# Patient Record
Sex: Male | Born: 1970 | Race: White | Hispanic: No | Marital: Single | Smoking: Never smoker
Health system: Southern US, Community
[De-identification: ages and names within clinical notes are randomized; demographics above are authoritative.]

## PROBLEM LIST (undated history)

## (undated) HISTORY — PX: FRACTURE SURGERY: SHX138

---

## 2002-06-23 ENCOUNTER — Inpatient Hospital Stay (HOSPITAL_COMMUNITY): Admission: AC | Admit: 2002-06-23 | Discharge: 2002-06-26 | Payer: Self-pay

## 2002-06-23 ENCOUNTER — Encounter: Payer: Self-pay | Admitting: Emergency Medicine

## 2002-06-23 ENCOUNTER — Encounter: Payer: Self-pay | Admitting: General Surgery

## 2002-06-24 ENCOUNTER — Encounter: Payer: Self-pay | Admitting: General Surgery

## 2002-07-02 ENCOUNTER — Ambulatory Visit (HOSPITAL_COMMUNITY): Admission: RE | Admit: 2002-07-02 | Discharge: 2002-07-02 | Payer: Self-pay | Admitting: General Surgery

## 2002-07-02 ENCOUNTER — Encounter: Payer: Self-pay | Admitting: General Surgery

## 2002-07-06 ENCOUNTER — Encounter: Payer: Self-pay | Admitting: Specialist

## 2002-07-06 ENCOUNTER — Ambulatory Visit (HOSPITAL_BASED_OUTPATIENT_CLINIC_OR_DEPARTMENT_OTHER): Admission: RE | Admit: 2002-07-06 | Discharge: 2002-07-06 | Payer: Self-pay | Admitting: Specialist

## 2002-07-08 ENCOUNTER — Encounter: Payer: Self-pay | Admitting: Vascular Surgery

## 2002-07-12 ENCOUNTER — Ambulatory Visit (HOSPITAL_COMMUNITY): Admission: RE | Admit: 2002-07-12 | Discharge: 2002-07-12 | Payer: Self-pay | Admitting: Vascular Surgery

## 2002-07-15 ENCOUNTER — Encounter: Payer: Self-pay | Admitting: Specialist

## 2002-07-15 ENCOUNTER — Ambulatory Visit (HOSPITAL_BASED_OUTPATIENT_CLINIC_OR_DEPARTMENT_OTHER): Admission: RE | Admit: 2002-07-15 | Discharge: 2002-07-16 | Payer: Self-pay | Admitting: Specialist

## 2003-09-03 ENCOUNTER — Inpatient Hospital Stay (HOSPITAL_COMMUNITY): Admission: EM | Admit: 2003-09-03 | Discharge: 2003-09-09 | Payer: Self-pay

## 2009-06-19 ENCOUNTER — Encounter: Payer: Self-pay | Admitting: Emergency Medicine

## 2009-06-19 ENCOUNTER — Inpatient Hospital Stay (HOSPITAL_COMMUNITY): Admission: EM | Admit: 2009-06-19 | Discharge: 2009-06-21 | Payer: Self-pay | Admitting: Neurosurgery

## 2009-07-03 ENCOUNTER — Encounter: Admission: RE | Admit: 2009-07-03 | Discharge: 2009-07-03 | Payer: Self-pay | Admitting: Neurosurgery

## 2009-07-31 ENCOUNTER — Encounter: Admission: RE | Admit: 2009-07-31 | Discharge: 2009-07-31 | Payer: Self-pay | Admitting: Neurosurgery

## 2009-09-11 ENCOUNTER — Encounter: Admission: RE | Admit: 2009-09-11 | Discharge: 2009-09-11 | Payer: Self-pay | Admitting: Neurosurgery

## 2009-10-23 ENCOUNTER — Encounter: Admission: RE | Admit: 2009-10-23 | Discharge: 2009-10-23 | Payer: Self-pay | Admitting: Neurosurgery

## 2010-12-05 IMAGING — CR DG KNEE 1-2V PORT*L*
2 series · 2 of 2 positions shown · non-contrast
Comparison: None

CLINICAL DATA: Lateral pain.  Recent trauma.

PORTABLE LEFT KNEE - 1-2 VIEW

[ap/obl knee]
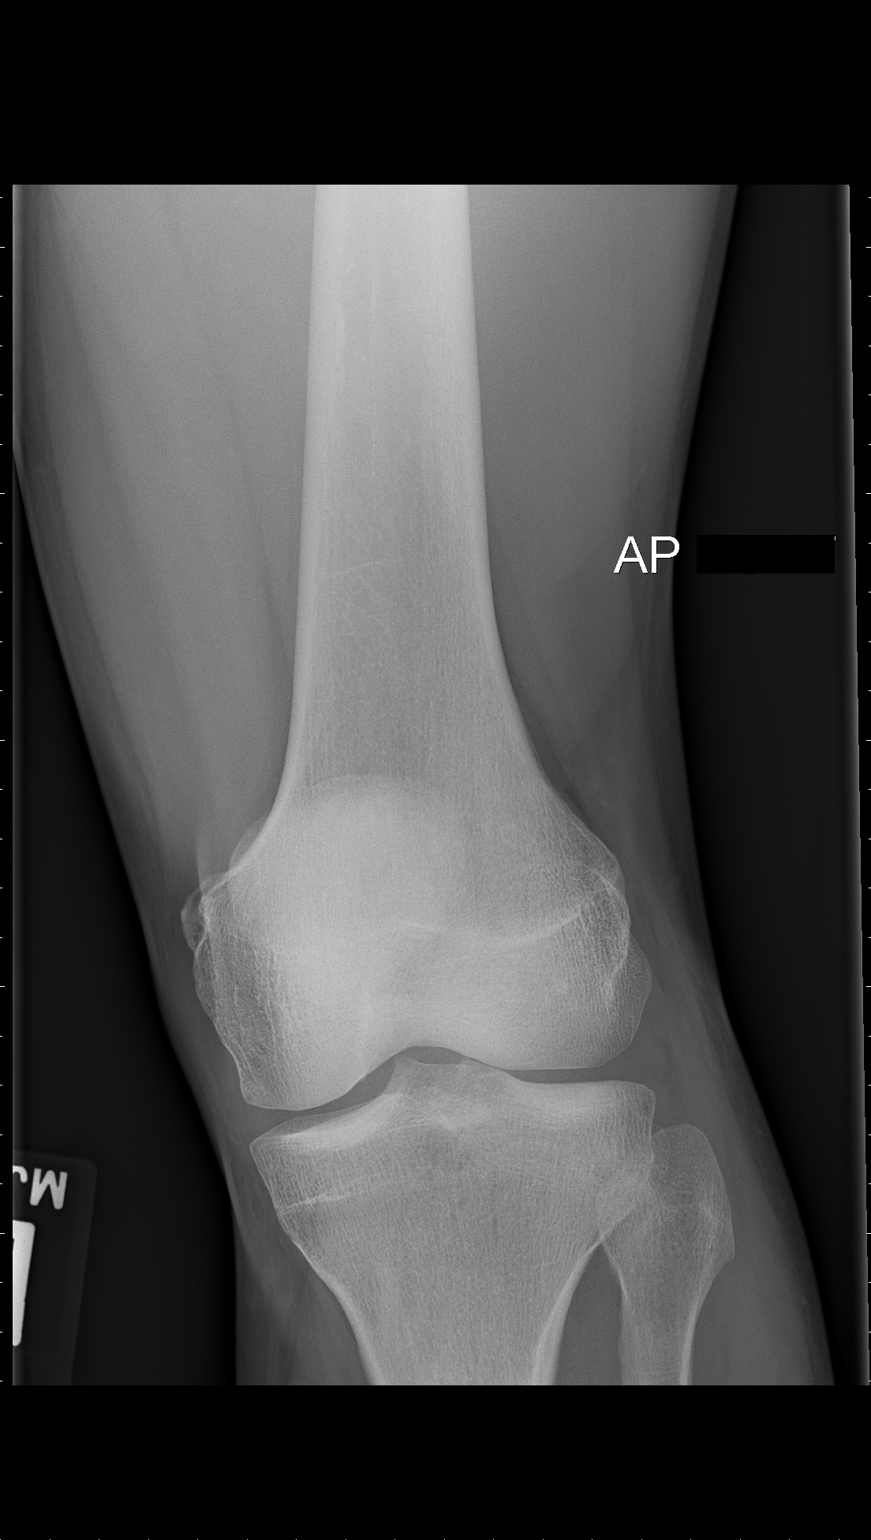

[knee lat]
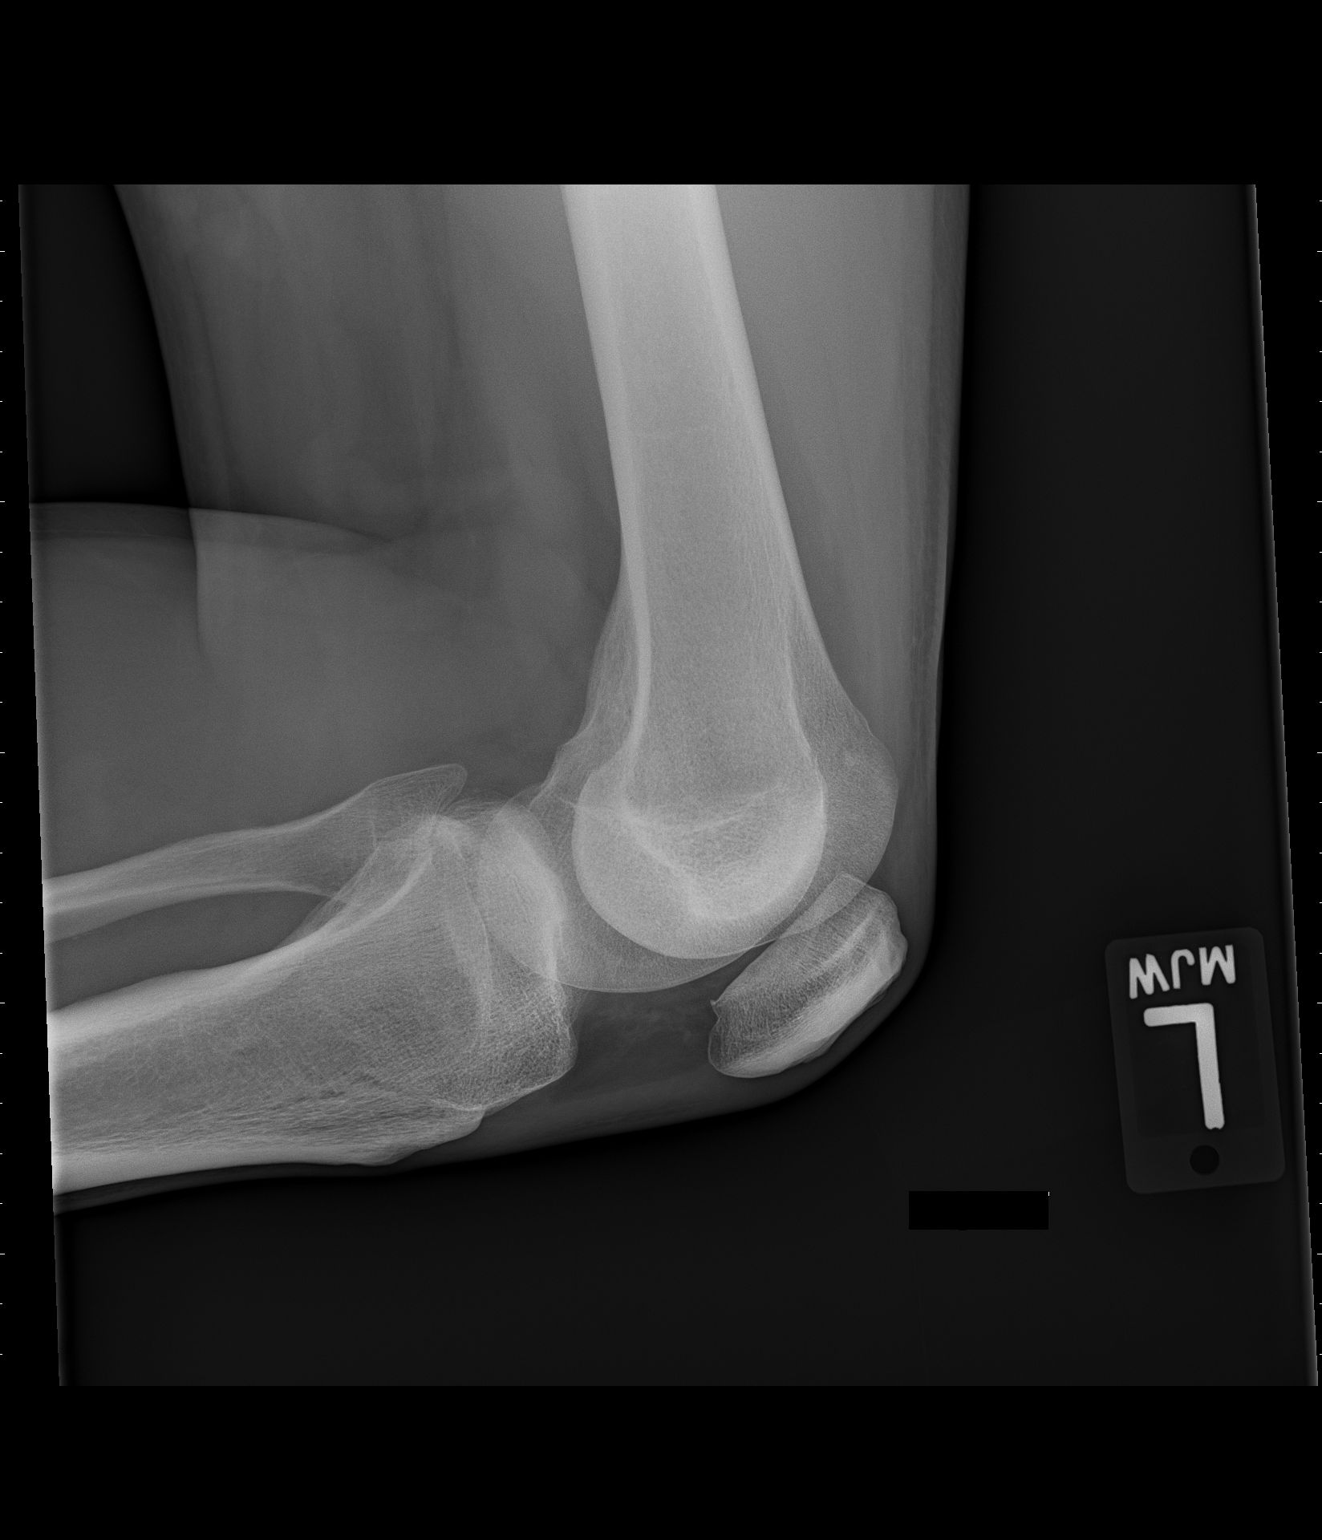

[2 of 2 positions shown; findings below may reference images not displayed]

FINDINGS: No effusion.  Tiny spur from the inferior margin of the
patellar articular surface. Negative for fracture, dislocation, or
other acute abnormality.  Normal alignment and mineralization. No
significant degenerative change.  Regional soft tissues
unremarkable.
IMPRESSION: 1.  Negative for acute abnormality.  If symptoms persist, consider
standard four view exam or MR for further evaluation once the
patient is stable.

## 2010-12-28 LAB — CBC
HCT: 40.4 % (ref 39.0–52.0)
HCT: 41.3 % (ref 39.0–52.0)
Hemoglobin: 13.7 g/dL (ref 13.0–17.0)
Hemoglobin: 14.2 g/dL (ref 13.0–17.0)
MCHC: 34 g/dL (ref 30.0–36.0)
MCHC: 34.4 g/dL (ref 30.0–36.0)
MCV: 94.1 fL (ref 78.0–100.0)
MCV: 94.3 fL (ref 78.0–100.0)
Platelets: 178 10*3/uL (ref 150–400)
Platelets: 182 10*3/uL (ref 150–400)
RBC: 4.3 MIL/uL (ref 4.22–5.81)
RBC: 4.38 MIL/uL (ref 4.22–5.81)
RDW: 12.1 % (ref 11.5–15.5)
RDW: 12.3 % (ref 11.5–15.5)
WBC: 6.8 10*3/uL (ref 4.0–10.5)
WBC: 9 10*3/uL (ref 4.0–10.5)

## 2010-12-28 LAB — PROTIME-INR
INR: 1.1 (ref 0.00–1.49)
Prothrombin Time: 14.2 seconds (ref 11.6–15.2)

## 2010-12-28 LAB — BASIC METABOLIC PANEL
BUN: 7 mg/dL (ref 6–23)
CO2: 31 mEq/L (ref 19–32)
Calcium: 8.6 mg/dL (ref 8.4–10.5)
Chloride: 103 mEq/L (ref 96–112)
Creatinine, Ser: 1.12 mg/dL (ref 0.4–1.5)
GFR calc Af Amer: >60 mL/min (ref >60)
GFR calc non Af Amer: >60 mL/min (ref >60)
Glucose, Bld: 123 mg/dL — ABNORMAL HIGH (ref 70–99)
Potassium: 3.5 mEq/L (ref 3.5–5.1)
Sodium: 140 mEq/L (ref 135–145)

## 2010-12-28 LAB — APTT: aPTT: 26 seconds (ref 24–37)
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
## 2011-02-08 NOTE — Op Note (Signed)
NAME:  Riley Jacobson, Riley Jacobson                      ACCOUNT NO.:  [id number]   MEDICAL RECORD NO.:  [id number]                   PATIENT TYPE:  AMB   LOCATION:  DSC                                  FACILITY:  [facility]   PHYSICIAN:  Erasmo Leventhal, MD           DATE OF BIRTH:  April 21, 1971   DATE OF PROCEDURE:  DATE OF DISCHARGE:                                 OPERATIVE REPORT   This is a dual procedure on Mr. Riley Jacobson's right shoulder.  I performed  the open reduction, internal fixation of the shoulder greater tuberosity  fracture with the surgeon, and Dr. Ranell Patrick was the surgeon on the clavicle  open reduction internal fixation.   PREOPERATIVE DIAGNOSES:  Right shoulder fracture dislocation.   POSTOPERATIVE DIAGNOSES:  Right shoulder fracture dislocation.   PROCEDURE:  Right shoulder open reduction, internal fixation, of greater  tuberosity fracture, with repair of rotator cuff.   SURGEON:  Erasmo Leventhal, M.D.   FIRST ASSISTANT:  Almedia Balls. Ranell Patrick, M.D.   SECOND ASSISTANT:  Druscilla Brownie. Underwood, P.A.-C.   SECOND PROCEDURE:   PREOPERATIVE DIAGNOSES:  Displaced, unstable clavicle fracture.   POSTOPERATIVE DIAGNOSES:  Displaced, unstable clavicle fracture.   PROCEDURE:  Open reduction, internal fixation of right clavicle fracture.   SURGEON:  Almedia Balls. Ranell Patrick, M.D.   FIRST ASSISTANT:  Erasmo Leventhal, M.D.   SECOND ASSISTANT:  Druscilla Brownie. Underwood, P.A.-C.   ANESTHESIA:  General endotracheal.   ESTIMATED BLOOD LOSS:  800 cc.   DRAINS:  None.   COMPLICATIONS:  None.   DISPOSITION:  PACU, stable.   OPERATIVE DETAILS:  The patient and family were counseled in the holding  area.  Correct facility was identified.  The patient had determined that he  did not want to do a preoperative interscalene block.  Preoperative  antibiotics had been given.  He was taken to the operating room and placed  in the supine position under general anesthesia.  He was placed  in the  modified beach chair position and properly padded and bumped.  Vascular  examination of the right hand had remained unchanged.  He had very limited  range of motion of the right shoulder at this point in time with gentle  manipulation under anesthesia just to check him, but it was not manipulated.  The C-arm was utilized to confirm it about the clavicle and the greater  tuberosity region.   He was then prepped with Duraprep and draped in a sterile fashion.  A  lateral deltoid split incision was made through the skin and subcutaneous  tissues.  The deltoid was split a total of 5 cm.  A tag suture was placed to  protect the axillary nerve.  He had a preoperative x-ray nerve deficit, and  it was protected throughout the entire case.   The subacromial region was then opened with finger palpation to involve the  subacromial adhesions which were present.  The fracture was  now three weeks  ago and had begun to scarify.  The greater tuberosity fracture could be seen  and displaced significantly posteriorly.  It was then mobilized. The rotator  cuff area was then opened.  The greater tuberosity defect area was  identified.  At this point in time, looking posteriorly, the biceps tendon  had slipped posterior to the biceps and posterior to the humeral head.  It  was brought around anteriorly and reduced into its anatomic position.  At  this point in time, quite a bit of time and attention were given to define  the pathology. The subscapularis appeared to be intact.  The rotator cuff  interval was completely torn.  The rotator cuff was retracted back with the  larger greater tuberosity fracture.  I intra-articularly inspected the  shoulder joint.  The articular cartilage appeared to be healthy.  We also  gently placed an arthroscope in the joint to look at this and also help this  and we could see better.  The biceps was attached superiorly to the labrum  and the superior glenoid tubercle.   I mobilized the rotator cuff, both intra-  articular and extra-articular.  At this point in time, the early callus and  hematoma was removed from the greater tuberosity insertion site.  At this  point in time, the bone broke quite a bit, and that was the reason for the  majority of the blood loss for this case.  I mobilized this fracture.  It  was noted at this point in time that we tried to repair it with #2 fiberwire  sutures in a suture-type figure-of-eight fixation.  Three sutures were  placed into the rotator cuff.  Drill holes were made through the greater  tuberosity fracture and corresponding the humerus, and then these were  crossed.  In order to supplement this, it was decided to use a 4-0 screw due  to the large size of the bone and the good quality of bone.  A pin was  placed.  We used a DePuy 4.0 cannulated screw with the soft tissue washer  placed utilizing the standard technique with a C-arm to make sure we had  excellent position and no complications or problems.  The suture was then  tied down.  The rotator cuff had been advanced and repaired at this point in  time.  I did leave the rotator cuff interval open.  There was a small gap  there, and I felt that there would be some degree of scarring anteriorly.  It appeared that the inferior glenohumeral ligament complex appeared to be  intact the best that we could tell through this limited approach, but we  could only see so much through a deltoid-splitting incision.  The shoulder  appeared to be stable at this point in time and had a much better range of  motion.  The biceps tendon was rather tethered, but this was not a good  location or position to perform a biceps tenodesis.  With that in mind, it  was then sutured to the anterior rotator cuff with an in situ tendodesis to  the tendon to help control the motion of the biceps and not sublux or  displace it aberrantly.  It was also tracked going down the lesser tuberosity  region to the root where the subscapularis was attached, and we  very cautiously made sure that it did not displace, and it did not.  A C-arm  showed excellent reduction of the fracture.  We placed the implants, and  shoulder joint was anatomically reduced. The wounds were copiously  irrigated.  Tag sutures were made from the distal aspect of the biceps  split.  The biceps was meticulously repaired back with Vicryl.  It had very  little detachment laterally.  The subcu was closed with Vicryl, and the skin  was closed with subcuticular Monocryl suture.   At this point in time, Dr. Ranell Patrick began the case, and he directed his  attention to the clavicle.  He will be dictating that operative report under  a separate report itself.   After Dr. Ranell Patrick repaired the clavicle and obtained hemostasis, the  overlying subcu was closed with Vicryl and the skin and subcuticular with  Monocryl suture.  The posterior wound was closed with nylon and the lateral  wound with subcuticular Monocryl suture.  He had been given another gram of  Ancef intravenously also during the case.  A sterile dressing was applied to  the shoulder.  The pulse was excellent.  He had an unchanged vascular  examination of the hand at the end of the case with excellent pulse in the  ulnar artery and good capillary refill and circulation of the hand, which  was the same that he had been preoperatively.  He was placed in a shoulder  immobilizer.  Due to the fact that he had received 3000 cc of fluid during  the case, he underwent an in and out catheterization with only 30 cc of  urine being removed.  This was done by the OR circulating nurse in a sterile  fashion.  He was then awakened and extubated and taken to the operating room  in a stable condition.  Sponge and needle counts were correct.  There were  no complications.                                               Erasmo Leventhal, MD    RAC/MEDQ  D:  07/15/2002  T:   07/16/2002  Job:  [id number]
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
## 2013-02-22 ENCOUNTER — Encounter (HOSPITAL_COMMUNITY): Payer: Self-pay

## 2013-02-22 ENCOUNTER — Emergency Department (HOSPITAL_COMMUNITY): Payer: No Typology Code available for payment source

## 2013-02-22 ENCOUNTER — Emergency Department (HOSPITAL_COMMUNITY)
Admission: EM | Admit: 2013-02-22 | Discharge: 2013-02-22 | Disposition: A | Discharge status: Home / Self Care | Payer: No Typology Code available for payment source | Attending: Emergency Medicine | Admitting: Emergency Medicine

## 2013-02-22 DIAGNOSIS — I8000 Phlebitis and thrombophlebitis of superficial vessels of unspecified lower extremity: Secondary | ICD-10-CM | POA: Insufficient documentation

## 2013-02-22 DIAGNOSIS — I809 Phlebitis and thrombophlebitis of unspecified site: Secondary | ICD-10-CM

## 2013-02-22 DIAGNOSIS — Z79899 Other long term (current) drug therapy: Secondary | ICD-10-CM | POA: Insufficient documentation

## 2013-02-22 MED ORDER — NAPROXEN 500 MG PO TABS: 500.0000 mg | ORAL_TABLET | Freq: Two times a day (BID) | ORAL | Status: DC

## 2013-02-22 MED ORDER — TRAMADOL HCL 50 MG PO TABS: 50.0000 mg | ORAL_TABLET | Freq: Four times a day (QID) | ORAL | Status: DC | PRN

## 2013-02-22 MED ORDER — OXYCODONE-ACETAMINOPHEN 5-325 MG PO TABS
1.0000 | ORAL_TABLET | Freq: Once | ORAL | Status: AC
  Administered 2013-02-22: 1 via ORAL
  Filled 2013-02-22: qty 1

## 2013-02-22 NOTE — ED Notes (Signed)
Pt states since Thursday he has had pain to left upper leg/thigh and states it is painful to put weight on it.  Pt does not know why the leg hurts, denies injury or trauma.

## 2013-02-22 NOTE — ED Notes (Signed)
Offered Wheelchair at d/c, refused and insisted on ambulating out

## 2013-02-22 NOTE — ED Provider Notes (Signed)
History     CSN: [id number]  Arrival date & time 02/22/13  1006   First MD Initiated Contact with Patient 02/22/13 1316      Chief Complaint  Patient presents with  . Leg Pain    (Consider location/radiation/quality/duration/timing/severity/associated sxs/prior treatment) HPI  History reviewed. No pertinent past medical history.  History reviewed. No pertinent past surgical history.  No family history on file.  History  Substance Use Topics  . Smoking status: Never Smoker   . Smokeless tobacco: Not on file  . Alcohol Use: Yes      Review of Systems  Allergies  Review of patient's allergies indicates no known allergies.  Home Medications   Current Outpatient Rx  Name  Route  Sig  Dispense  Refill  . hydrocortisone cream 1 %   Topical   Apply 1 application topically 2 (two) times daily as needed (poison oak).         . Multiple Vitamin (MULTIVITAMIN WITH MINERALS) TABS   Oral   Take 1 tablet by mouth daily.         . naproxen (NAPROSYN) 500 MG tablet   Oral   Take 1 tablet (500 mg total) by mouth 2 (two) times daily.   20 tablet   0   . traMADol (ULTRAM) 50 MG tablet   Oral   Take 1 tablet (50 mg total) by mouth every 6 (six) hours as needed for pain.   15 tablet   0     BP 145/92  Pulse 72  Temp(Src) 98.9 F (37.2 C) (Oral)  Resp 18  Ht 6' (1.829 m)  Wt 185 lb (83.915 kg)  BMI 25.08 kg/m2  SpO2 98%  Physical Exam  ED Course  Procedures (including critical care time)  Labs Reviewed - No data to display US Venous Img Lower Unilateral Left  02/22/2013   *RADIOLOGY REPORT*  Clinical Data: Left leg pain.  Left leg edema.  Redness in the medial thigh.  LEFT LOWER EXTREMITY VENOUS DUPLEX ULTRASOUND  Technique:  Gray-scale sonography with graded compression, as well as color Doppler and duplex ultrasound, were performed to evaluate the deep venous system of the lower extremity from the level of the common femoral vein through the popliteal and  proximal calf veins. Spectral Doppler was utilized to evaluate flow at rest and with distal augmentation maneuvers.  Comparison:  None.  Findings: There is no evidence of DVT in the lower extremity. Normal phasicity, augmentation, and compressibility in the saphenofemoral junction, common femoral vein, femoral vein, deep femoral vein, and popliteal vein.  Varicose veins are present in the medial left thigh with an area of superficial thrombophlebitis.  IMPRESSION:  1.  Negative forward deep venous thrombosis. 2.  Superficial thrombophlebitis of varicosities in the medial left thigh.   Original Report Authenticated By: Andreas Newport, M.D.     1. Phlebitis       MDM  Superficial phlebitis        Benny Lennert, MD 02/22/13 1534

## 2013-02-22 NOTE — ED Provider Notes (Signed)
History     This chart was scribed for Benny Lennert, MD, MD by Smitty Pluck, ED Scribe. The patient was seen in room  and the patient's care was started at 1:19 PM.   CSN: [id number]  Arrival date & time 02/22/13  1006       Chief Complaint  Patient presents with  . Leg Pain     Patient is a 42 y.o. male presenting with leg pain. The history is provided by the patient and medical records. No language interpreter was used.  Leg Pain Location:  Leg Time since incident:  4 days Injury: no   Leg location:  L leg Pain details:    Radiates to:  Does not radiate   Severity:  Moderate   Onset quality:  Sudden   Timing:  Constant   Progression:  Unchanged Chronicity:  New Dislocation: no   Foreign body present:  No foreign bodies Prior injury to area:  No Relieved by:  None tried Worsened by:  Bearing weight Ineffective treatments:  None tried Associated symptoms: no back pain and no fatigue    HPI Comments: Riley Jacobson is a 42 y.o. male who presents to the Emergency Department complaining of constant, moderate left leg pain onset 4 days ago. Pt reports pain starts at his left thigh and radiates to calf. He mentions he noticed the pain after he returned home from traveling in car for work. He states that bearing weight aggravates the pain. Pt denies injury to left leg, fever, chills, nausea, vomiting, diarrhea, weakness, cough, SOB and any other pain.   History reviewed. No pertinent past medical history.  History reviewed. No pertinent past surgical history.  No family history on file.  History  Substance Use Topics  . Smoking status: Never Smoker   . Smokeless tobacco: Not on file  . Alcohol Use: Yes      Review of Systems  Constitutional: Negative for appetite change and fatigue.  HENT: Negative for congestion, sinus pressure and ear discharge.   Eyes: Negative for discharge.  Respiratory: Negative for cough.   Cardiovascular: Negative for chest pain.   Gastrointestinal: Negative for abdominal pain and diarrhea.  Genitourinary: Negative for frequency and hematuria.  Musculoskeletal: Negative for back pain.  Skin: Negative for rash.  Neurological: Negative for seizures and headaches.  Psychiatric/Behavioral: Negative for hallucinations.    Allergies  Review of patient's allergies indicates no known allergies.  Home Medications   Current Outpatient Rx  Name  Route  Sig  Dispense  Refill  . hydrocortisone cream 1 %   Topical   Apply 1 application topically 2 (two) times daily as needed (poison oak).         . Multiple Vitamin (MULTIVITAMIN WITH MINERALS) TABS   Oral   Take 1 tablet by mouth daily.           BP 147/72  Pulse 89  Temp(Src) 98.9 F (37.2 C) (Oral)  Resp 16  Ht 6' (1.829 m)  Wt 185 lb (83.915 kg)  BMI 25.08 kg/m2  SpO2 100%  Physical Exam  Nursing note and vitals reviewed. Constitutional: He is oriented to person, place, and time. He appears well-developed.  HENT:  Head: Normocephalic.  Eyes: Conjunctivae are normal.  Neck: No tracheal deviation present.  Cardiovascular:  No murmur heard. Musculoskeletal: Normal range of motion.  Distal left posterior thigh has swelling and tenderness Swelling and tenderness to left calf Skin of posterior distal thigh has purplish color tint.  Neurological: He is alert and oriented to person, place, and time.  Skin: Skin is warm.  Psychiatric: He has a normal mood and affect. His behavior is normal.    ED Course  Procedures (including critical care time) DIAGNOSTIC STUDIES: Oxygen Saturation is 100% on room air, normal by my interpretation.    COORDINATION OF CARE: 1:23 PM Discussed ED treatment with pt and pt agrees.  1:23 PM Ordered:  Medications  oxyCODONE-acetaminophen (PERCOCET/ROXICET) 5-325 MG per tablet 1 tablet (1 tablet Oral Given 02/22/13 1414)    3:28 PM Recheck: Discussed lab results and treatment course (warm compresses and anti  inflammatory meds) with pt. Pt is feeling better after medication. Pt is ready for discharge.     Labs Reviewed - No data to display US Venous Img Lower Unilateral Left  02/22/2013   *RADIOLOGY REPORT*  Clinical Data: Left leg pain.  Left leg edema.  Redness in the medial thigh.  LEFT LOWER EXTREMITY VENOUS DUPLEX ULTRASOUND  Technique:  Gray-scale sonography with graded compression, as well as color Doppler and duplex ultrasound, were performed to evaluate the deep venous system of the lower extremity from the level of the common femoral vein through the popliteal and proximal calf veins. Spectral Doppler was utilized to evaluate flow at rest and with distal augmentation maneuvers.  Comparison:  None.  Findings: There is no evidence of DVT in the lower extremity. Normal phasicity, augmentation, and compressibility in the saphenofemoral junction, common femoral vein, femoral vein, deep femoral vein, and popliteal vein.  Varicose veins are present in the medial left thigh with an area of superficial thrombophlebitis.  IMPRESSION:  1.  Negative forward deep venous thrombosis. 2.  Superficial thrombophlebitis of varicosities in the medial left thigh.   Original Report Authenticated By: Andreas Newport, M.D.     No diagnosis found.    MDM      The chart was scribed for me under my direct supervision.  I personally performed the history, physical, and medical decision making and all procedures in the evaluation of this patient.Benny Lennert, MD 03/03/13 [phone]

## 2013-02-22 NOTE — ED Notes (Addendum)
Pain to leg since Thursday, redness noted to left inner thigh with warm to touch and tenderness, states pain radiates to left lower leg, pain worse with ambulating

## 2013-05-30 ENCOUNTER — Emergency Department (HOSPITAL_COMMUNITY): Payer: 59

## 2013-05-30 ENCOUNTER — Emergency Department (HOSPITAL_COMMUNITY)
Admission: EM | Admit: 2013-05-30 | Discharge: 2013-05-30 | Disposition: A | Discharge status: Home / Self Care | Payer: 59 | Attending: Emergency Medicine | Admitting: Emergency Medicine

## 2013-05-30 ENCOUNTER — Encounter (HOSPITAL_COMMUNITY): Payer: Self-pay

## 2013-05-30 DIAGNOSIS — M25469 Effusion, unspecified knee: Secondary | ICD-10-CM | POA: Insufficient documentation

## 2013-05-30 DIAGNOSIS — Y92410 Unspecified street and highway as the place of occurrence of the external cause: Secondary | ICD-10-CM | POA: Insufficient documentation

## 2013-05-30 DIAGNOSIS — Y9355 Activity, bike riding: Secondary | ICD-10-CM | POA: Insufficient documentation

## 2013-05-30 DIAGNOSIS — M25461 Effusion, right knee: Secondary | ICD-10-CM

## 2013-05-30 MED ORDER — OXYCODONE-ACETAMINOPHEN 5-325 MG PO TABS
1.0000 | ORAL_TABLET | Freq: Once | ORAL | Status: AC
  Administered 2013-05-30: 1 via ORAL
  Filled 2013-05-30: qty 1

## 2013-05-30 MED ORDER — IBUPROFEN 600 MG PO TABS: 600.0000 mg | ORAL_TABLET | Freq: Four times a day (QID) | ORAL | Status: DC | PRN

## 2013-05-30 MED ORDER — OXYCODONE-ACETAMINOPHEN 5-325 MG PO TABS: 1.0000 | ORAL_TABLET | ORAL | Status: DC | PRN
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
## 2013-06-01 NOTE — ED Provider Notes (Signed)
Medical screening examination/treatment/procedure(s) were performed by non-physician practitioner and as supervising physician I was immediately available for consultation/collaboration.   Laray Anger, DO 06/01/13 2217

## 2013-06-03 ENCOUNTER — Ambulatory Visit (INDEPENDENT_AMBULATORY_CARE_PROVIDER_SITE_OTHER): Payer: Commercial Managed Care - PPO | Admitting: Orthopedic Surgery

## 2013-06-03 ENCOUNTER — Encounter: Payer: Self-pay | Admitting: Orthopedic Surgery

## 2013-06-03 VITALS — BP 105/76 | Ht 71.0 in | Wt 185.0 lb

## 2013-06-03 DIAGNOSIS — IMO0002 Reserved for concepts with insufficient information to code with codable children: Secondary | ICD-10-CM | POA: Insufficient documentation

## 2013-06-03 DIAGNOSIS — S8391XA Sprain of unspecified site of right knee, initial encounter: Secondary | ICD-10-CM

## 2013-06-03 MED ORDER — IBUPROFEN 800 MG PO TABS: 800.0000 mg | ORAL_TABLET | Freq: Three times a day (TID) | ORAL | Status: DC | PRN
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
## 2013-06-03 NOTE — Patient Instructions (Addendum)
Ice to control swelling   Ibuprofen for pain and swelling  Re examine the knee in a few weeks   Wear knee brace to stabilize knee

## 2013-06-22 ENCOUNTER — Ambulatory Visit: Payer: 59 | Admitting: Orthopedic Surgery

## 2016-05-10 ENCOUNTER — Emergency Department (HOSPITAL_COMMUNITY)
Admission: EM | Admit: 2016-05-10 | Discharge: 2016-05-10 | Disposition: A | Discharge status: Home / Self Care | Payer: No Typology Code available for payment source | Attending: Emergency Medicine | Admitting: Emergency Medicine

## 2016-05-10 ENCOUNTER — Encounter (HOSPITAL_COMMUNITY): Payer: Self-pay | Admitting: Emergency Medicine

## 2016-05-10 ENCOUNTER — Emergency Department (HOSPITAL_COMMUNITY): Payer: No Typology Code available for payment source

## 2016-05-10 DIAGNOSIS — Y929 Unspecified place or not applicable: Secondary | ICD-10-CM | POA: Diagnosis not present

## 2016-05-10 DIAGNOSIS — T07XXXA Unspecified multiple injuries, initial encounter: Secondary | ICD-10-CM

## 2016-05-10 DIAGNOSIS — T148XXA Other injury of unspecified body region, initial encounter: Secondary | ICD-10-CM

## 2016-05-10 DIAGNOSIS — S80212A Abrasion, left knee, initial encounter: Secondary | ICD-10-CM | POA: Insufficient documentation

## 2016-05-10 DIAGNOSIS — Y999 Unspecified external cause status: Secondary | ICD-10-CM | POA: Insufficient documentation

## 2016-05-10 DIAGNOSIS — S62112A Displaced fracture of triquetrum [cuneiform] bone, left wrist, initial encounter for closed fracture: Secondary | ICD-10-CM | POA: Insufficient documentation

## 2016-05-10 DIAGNOSIS — Y9355 Activity, bike riding: Secondary | ICD-10-CM | POA: Diagnosis not present

## 2016-05-10 DIAGNOSIS — M25532 Pain in left wrist: Secondary | ICD-10-CM | POA: Diagnosis present

## 2016-05-10 MED ORDER — HYDROCODONE-ACETAMINOPHEN 5-325 MG PO TABS: 1.0000 | ORAL_TABLET | ORAL | 0 refills | Status: DC | PRN

## 2016-05-10 MED ORDER — IBUPROFEN 600 MG PO TABS: 600.0000 mg | ORAL_TABLET | Freq: Four times a day (QID) | ORAL | 0 refills | Status: DC

## 2016-05-10 MED ORDER — IBUPROFEN 800 MG PO TABS
800.0000 mg | ORAL_TABLET | Freq: Once | ORAL | Status: AC
  Administered 2016-05-10: 800 mg via ORAL
  Filled 2016-05-10: qty 1

## 2016-05-10 MED ORDER — HYDROCODONE-ACETAMINOPHEN 5-325 MG PO TABS
2.0000 | ORAL_TABLET | Freq: Once | ORAL | Status: AC
  Administered 2016-05-10: 2 via ORAL
  Filled 2016-05-10: qty 2
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
## 2016-05-10 NOTE — Discharge Instructions (Signed)
Your x-rays reveal a avulsion fracture involving your left wrist. Please see Dr Merlyn LotKuzma for additional evaluation and management. Please apply ice, and user sling until seen by the orthopedic hand specialist. Use 600 mg of ibuprofen with breakfast, lunch, dinner, and at bedtime. Use Norco for more severe pain.

## 2018-04-04 ENCOUNTER — Emergency Department (HOSPITAL_COMMUNITY): Payer: Self-pay

## 2018-04-04 ENCOUNTER — Emergency Department (HOSPITAL_COMMUNITY)
Admission: EM | Admit: 2018-04-04 | Discharge: 2018-04-04 | Disposition: A | Discharge status: Home / Self Care | Payer: Self-pay | Attending: Emergency Medicine | Admitting: Emergency Medicine

## 2018-04-04 ENCOUNTER — Encounter (HOSPITAL_COMMUNITY): Payer: Self-pay | Admitting: Emergency Medicine

## 2018-04-04 ENCOUNTER — Other Ambulatory Visit: Payer: Self-pay

## 2018-04-04 DIAGNOSIS — S80811A Abrasion, right lower leg, initial encounter: Secondary | ICD-10-CM | POA: Insufficient documentation

## 2018-04-04 DIAGNOSIS — S0990XA Unspecified injury of head, initial encounter: Secondary | ICD-10-CM

## 2018-04-04 DIAGNOSIS — Z23 Encounter for immunization: Secondary | ICD-10-CM | POA: Insufficient documentation

## 2018-04-04 DIAGNOSIS — Y92481 Parking lot as the place of occurrence of the external cause: Secondary | ICD-10-CM | POA: Insufficient documentation

## 2018-04-04 DIAGNOSIS — S4991XA Unspecified injury of right shoulder and upper arm, initial encounter: Secondary | ICD-10-CM

## 2018-04-04 DIAGNOSIS — S40211A Abrasion of right shoulder, initial encounter: Secondary | ICD-10-CM | POA: Insufficient documentation

## 2018-04-04 DIAGNOSIS — T07XXXA Unspecified multiple injuries, initial encounter: Secondary | ICD-10-CM

## 2018-04-04 DIAGNOSIS — S50311A Abrasion of right elbow, initial encounter: Secondary | ICD-10-CM | POA: Insufficient documentation

## 2018-04-04 DIAGNOSIS — Y9355 Activity, bike riding: Secondary | ICD-10-CM | POA: Insufficient documentation

## 2018-04-04 DIAGNOSIS — Y999 Unspecified external cause status: Secondary | ICD-10-CM | POA: Insufficient documentation

## 2018-04-04 DIAGNOSIS — S0083XA Contusion of other part of head, initial encounter: Secondary | ICD-10-CM | POA: Insufficient documentation

## 2018-04-04 MED ORDER — TETANUS-DIPHTH-ACELL PERTUSSIS 5-2.5-18.5 LF-MCG/0.5 IM SUSP
0.5000 mL | Freq: Once | INTRAMUSCULAR | Status: AC
  Administered 2018-04-04: 0.5 mL via INTRAMUSCULAR
  Filled 2018-04-04: qty 0.5

## 2018-04-04 MED ORDER — FENTANYL CITRATE (PF) 100 MCG/2ML IJ SOLN
100.0000 ug | INTRAMUSCULAR | Status: DC | PRN
  Administered 2018-04-04 (×2): 100 ug via INTRAVENOUS
  Filled 2018-04-04 (×2): qty 2

## 2018-04-04 MED ORDER — IOHEXOL 300 MG/ML  SOLN
75.0000 mL | Freq: Once | INTRAMUSCULAR | Status: AC | PRN
  Administered 2018-04-04: 75 mL via INTRAVENOUS

## 2018-04-04 MED ORDER — SODIUM CHLORIDE 0.9 % IV BOLUS
500.0000 mL | Freq: Once | INTRAVENOUS | Status: AC
  Administered 2018-04-04: 500 mL via INTRAVENOUS

## 2018-04-04 MED ORDER — OXYCODONE-ACETAMINOPHEN 5-325 MG PO TABS: 1.0000 | ORAL_TABLET | ORAL | 0 refills | Status: DC | PRN

## 2018-04-04 MED ORDER — ONDANSETRON HCL 4 MG/2ML IJ SOLN
4.0000 mg | Freq: Once | INTRAMUSCULAR | Status: AC
  Administered 2018-04-04: 4 mg via INTRAVENOUS
  Filled 2018-04-04: qty 2
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
## 2018-04-04 NOTE — Discharge Instructions (Addendum)
Use ice and sore spots 3 or 4 times a day for 2 days.  After that use heat.  Keep the abrasions clean with soap and water daily and apply an antibiotic ointment such as Neosporin or bacitracin.  Use the arm sling as needed for comfort.  Start some gentle shoulder exercises after a few days to improve your mobility.  Follow-up with your doctor as needed for problems.  The CT scan showed some coronary artery calcification, which will need follow-up at some point, with a primary care doctor.  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

## 2020-08-01 ENCOUNTER — Emergency Department (HOSPITAL_COMMUNITY)
Admission: EM | Admit: 2020-08-01 | Discharge: 2020-08-01 | Disposition: A | Discharge status: Home / Self Care | Payer: Self-pay | Attending: Emergency Medicine | Admitting: Emergency Medicine

## 2020-08-01 ENCOUNTER — Encounter (HOSPITAL_COMMUNITY): Payer: Self-pay | Admitting: *Deleted

## 2020-08-01 ENCOUNTER — Emergency Department (HOSPITAL_COMMUNITY): Payer: Self-pay

## 2020-08-01 DIAGNOSIS — S81831A Puncture wound without foreign body, right lower leg, initial encounter: Secondary | ICD-10-CM | POA: Insufficient documentation

## 2020-08-01 DIAGNOSIS — W540XXA Bitten by dog, initial encounter: Secondary | ICD-10-CM | POA: Insufficient documentation

## 2020-08-01 MED ORDER — MORPHINE SULFATE (PF) 4 MG/ML IV SOLN
4.0000 mg | Freq: Once | INTRAVENOUS | Status: AC
  Administered 2020-08-01: 4 mg via INTRAMUSCULAR
  Filled 2020-08-01: qty 1

## 2020-08-01 MED ORDER — HYDROCODONE-ACETAMINOPHEN 5-325 MG PO TABS
1.0000 | ORAL_TABLET | ORAL | 0 refills | Status: DC | PRN
Start: 2020-08-01 — End: 2023-12-12

## 2020-08-01 MED ORDER — AMOXICILLIN-POT CLAVULANATE 875-125 MG PO TABS
1.0000 | ORAL_TABLET | Freq: Once | ORAL | Status: AC
  Administered 2020-08-01: 1 via ORAL
  Filled 2020-08-01: qty 1

## 2020-08-01 MED ORDER — HYDROCODONE-ACETAMINOPHEN 5-325 MG PO TABS
1.0000 | ORAL_TABLET | Freq: Once | ORAL | Status: AC
  Administered 2020-08-01: 1 via ORAL
  Filled 2020-08-01: qty 1

## 2020-08-01 MED ORDER — AMOXICILLIN-POT CLAVULANATE 875-125 MG PO TABS: 1.0000 | ORAL_TABLET | Freq: Two times a day (BID) | ORAL | 0 refills | Status: AC

## 2020-08-01 NOTE — ED Notes (Signed)
Pt uncooperative with answering the questions in triage.
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
## 2021-01-28 ENCOUNTER — Ambulatory Visit
Admission: EM | Admit: 2021-01-28 | Discharge: 2021-01-28 | Disposition: A | Discharge status: Home / Self Care | Payer: Self-pay | Attending: Family Medicine | Admitting: Family Medicine

## 2021-01-28 ENCOUNTER — Encounter: Payer: Self-pay | Admitting: Emergency Medicine

## 2021-01-28 ENCOUNTER — Other Ambulatory Visit: Payer: Self-pay

## 2021-01-28 DIAGNOSIS — L259 Unspecified contact dermatitis, unspecified cause: Secondary | ICD-10-CM

## 2021-01-28 MED ORDER — DEXAMETHASONE SODIUM PHOSPHATE 10 MG/ML IJ SOLN
10.0000 mg | Freq: Once | INTRAMUSCULAR | Status: AC
  Administered 2021-01-28: 10 mg via INTRAMUSCULAR

## 2021-01-28 NOTE — ED Triage Notes (Signed)
Poison ivy rash all over body since yesterday.

## 2021-01-28 NOTE — ED Notes (Signed)
Pt yelling out in the room for staff to hurry up he needs a shot.  Informed pt that the provider will be in as soon as possible to see him.

## 2021-01-28 NOTE — ED Provider Notes (Signed)
[facility]    CSN: [id number] Arrival date & time: 01/28/21  0818      History   Chief Complaint Chief Complaint  Patient presents with  . Rash    HPI Riley Jacobson is a 50 y.o. male.   HPI  Patient presents today for treatment of a generalized rash which is diffusely spread throughout his body.  Patient suspects that he is having contact with poison ivy.  Rash has been present x1 day.  Rash is pruritic. History reviewed. No pertinent past medical history.  Patient Active Problem List   Diagnosis Date Noted  . Knee sprain and strain 06/03/2013    Past Surgical History:  Procedure Laterality Date  . FRACTURE SURGERY         Home Medications    Prior to Admission medications   Medication Sig Start Date End Date Taking? Authorizing Provider  HYDROcodone-acetaminophen (NORCO/VICODIN) 5-325 MG tablet Take 1 tablet by mouth every 4 (four) hours as needed. 08/01/20   Henderly, Britni A, PA-C  naproxen sodium (ALEVE) 220 MG tablet Take 220 mg by mouth as needed (for pain).    [provider]  oxyCODONE-acetaminophen (PERCOCET/ROXICET) 5-325 MG tablet Take 1 tablet by mouth every 4 (four) hours as needed. 04/04/18   Mancel Bale, MD    Family History No family history on file.  Social History Social History   Tobacco Use  . Smoking status: Never Smoker  . Smokeless tobacco: Never Used  Vaping Use  . Vaping Use: Never used  Substance Use Topics  . Alcohol use: Yes    Comment: occ  . Drug use: No     Allergies   Patient has no known allergies.   Review of Systems Review of Systems Pertinent negatives listed in HPI   Physical Exam Triage Vital Signs ED Triage Vitals  Enc Vitals Group     BP 01/28/21 0832 (!) 146/94     Pulse Rate 01/28/21 0832 70     Resp 01/28/21 0832 18     Temp 01/28/21 0832 98 F (36.7 C)     Temp Source 01/28/21 0832 Oral     SpO2 01/28/21 0832 97 %     Weight --      Height --      Head  Circumference --      Peak Flow --      Pain Score 01/28/21 0831 0     Pain Loc --      Pain Edu? --      Excl. in GC? --    No data found.  Updated Vital Signs BP (!) 146/94 (BP Location: Right Arm)   Pulse 70   Temp 98 F (36.7 C) (Oral)   Resp 18   SpO2 97%   Visual Acuity Right Eye Distance:   Left Eye Distance:   Bilateral Distance:    Right Eye Near:   Left Eye Near:    Bilateral Near:     Physical Exam General appearance: alert, well developed, well nourished, cooperative Head: Normocephalic, without obvious abnormality, atraumatic Respiratory: Respirations even and unlabored, normal respiratory rate Heart: Rate and rhythm normal. No gallop or murmurs noted on exam  Extremities: No gross deformities Skin: Rash present macularpapular appearance generalized  psych: Appropriate mood and affect. Neurologic: No abnormal neurological deficits noted on exam  UC Treatments / Results  Labs (all labs ordered are listed, but only abnormal results are displayed) Labs Reviewed - No data to display  EKG   Radiology No results found.  Procedures Procedures (including critical care time)  Medications Ordered in UC Medications - No data to display  Initial Impression / Assessment and Plan / UC Course  I have reviewed the triage vital signs and the nursing notes.  Pertinent labs & imaging results that were available during my care of the patient were reviewed by me and considered in my medical decision making (see chart for details).      Contact dermatitis suspected irritant foraminotomy.  Patient given Decadron IM injection. PCP follow-up as needed. Final Clinical Impressions(s) / UC Diagnoses   Final diagnoses:  Contact dermatitis, unspecified contact dermatitis type, unspecified trigger   Discharge Instructions   None    ED Prescriptions    None     PDMP not reviewed this encounter.   Bing Neighbors, FNP 01/28/21 [phone]

## 2021-01-30 ENCOUNTER — Telehealth: Payer: Self-pay

## 2021-01-30 MED ORDER — PREDNISONE 10 MG (21) PO TBPK: ORAL_TABLET | Freq: Every day | ORAL | 0 refills | Status: DC

## 2023-12-12 ENCOUNTER — Ambulatory Visit
Admission: EM | Admit: 2023-12-12 | Discharge: 2023-12-12 | Disposition: A | Discharge status: Home / Self Care | Payer: Self-pay | Attending: Family Medicine | Admitting: Family Medicine

## 2023-12-12 ENCOUNTER — Encounter: Payer: Self-pay | Admitting: Emergency Medicine

## 2023-12-12 DIAGNOSIS — L255 Unspecified contact dermatitis due to plants, except food: Secondary | ICD-10-CM

## 2023-12-12 MED ORDER — METHYLPREDNISOLONE ACETATE 80 MG/ML IJ SUSP
80.0000 mg | Freq: Once | INTRAMUSCULAR | Status: AC
  Administered 2023-12-12: 80 mg via INTRAMUSCULAR

## 2023-12-12 MED ORDER — PREDNISONE 20 MG PO TABS: 40.0000 mg | ORAL_TABLET | Freq: Every day | ORAL | 0 refills | Status: AC

## 2023-12-12 NOTE — Discharge Instructions (Signed)
 Meds ordered this encounter  Medications   methylPREDNISolone acetate (DEPO-MEDROL) injection 80 mg

## 2023-12-12 NOTE — ED Provider Notes (Addendum)
  [facility]   [id number] 12/12/23 Arrival Time: 1039  ASSESSMENT & PLAN:  1. Rhus dermatitis    No signs of skin infection. OTC symptom care as needed.  Meds ordered this encounter  Medications   methylPREDNISolone acetate (DEPO-MEDROL) injection 80 mg   predniSONE (DELTASONE) 20 MG tablet    Sig: Take 2 tablets (40 mg total) by mouth daily.    Dispense:  10 tablet    Refill:  0     Follow-up Information     Benita Stabile, MD.   Specialty: Internal Medicine Why: If worsening or failing to improve as anticipated. Contact information: [address] [place] [employer] [facility] [address] [phone]                 Reviewed expectations re: course of current medical issues. Questions answered. Outlined signs and symptoms indicating need for more acute intervention. Understanding verbalized. After Visit Summary given.   SUBJECTIVE: History from: Patient. Riley Jacobson is a 54 y.o. male. Patient states he has a Poison ivy rash all over body x 3 days.  States his forearms are swollen from the rash.  Requests IM.  OBJECTIVE:  Vitals:   12/12/23 1043 12/12/23 1044  BP: (!) 146/82   Pulse: (!) 57   Resp: 18   Temp:  (!) 97 F (36.1 C)  TempSrc: Oral Temporal  SpO2: 96%     General appearance: alert; no distress Extremities: no edema Skin: warm and dry; areas of linear papules and vesicles with surrounding erythema mainly over upper arms and lower trunsk Neurologic: normal gait Psychological: alert and cooperative; normal mood and affect  Labs:  Labs Reviewed - No data to display  Imaging: No results found.  No Known Allergies  History reviewed. No pertinent past medical history. Social History   Socioeconomic History   Marital status: Single    Spouse name: Not on file   Number of children: Not on file   Years of education: Not on file   Highest education level: Not on file  Occupational History   Not on file  Tobacco Use   Smoking  status: Never   Smokeless tobacco: Never  Vaping Use   Vaping status: Never Used  Substance and Sexual Activity   Alcohol use: Yes    Comment: occ   Drug use: No   Sexual activity: Yes  Other Topics Concern   Not on file  Social History Narrative   Not on file   Social Drivers of Health   Financial Resource Strain: Not on file  Food Insecurity: Not on file  Transportation Needs: Not on file  Physical Activity: Not on file  Stress: Not on file  Social Connections: Not on file  Intimate Partner Violence: Not on file   History reviewed. No pertinent family history. Past Surgical History:  Procedure Laterality Date   FRACTURE SURGERY       Mardella Layman, MD 12/12/23 0160    Mardella Layman, MD 12/12/23 1150

## 2023-12-12 NOTE — ED Notes (Addendum)
 Pt became upset after being brought back immediately and not receiving a shot without even being seen by a provider. Pt came into hallway angry stating he could not be in closed spaces and he needed a drink of water. As the nurse was getting the water the patient stood in the hallway despite me asking him to return to the room while he waits for the water and the provider. Pt asked was he closer to getting his shot or the doctor coming to see him. The provider then went in with he patient. The nurse provided that for him. Pt then became agitated that we closed his door.

## 2023-12-12 NOTE — ED Triage Notes (Addendum)
 Patient states he has a Poison ivy rash all over body x 3 days.  States his forearms are swollen from the rash.
